# Patient Record
Sex: Female | Born: 1970 | Hispanic: Yes | Marital: Married | Smoking: Never smoker
Health system: Southern US, Community
[De-identification: ages and names within clinical notes are randomized; demographics above are authoritative.]

## PROBLEM LIST (undated history)

## (undated) DIAGNOSIS — I10 Essential (primary) hypertension: Secondary | ICD-10-CM

---

## 2017-04-14 LAB — POCT LIPID PANEL
HDL: 59
LDL: 108
TC: 189
TRG: 110

## 2017-04-14 LAB — GLUCOSE, POCT (MANUAL RESULT ENTRY): POC Glucose: 103 mg/dl — AB (ref 70–99)

## 2019-11-13 ENCOUNTER — Encounter (HOSPITAL_COMMUNITY): Payer: Self-pay

## 2019-11-13 ENCOUNTER — Emergency Department (HOSPITAL_COMMUNITY)
Admission: EM | Admit: 2019-11-13 | Discharge: 2019-11-14 | Disposition: A | Discharge status: Home / Self Care | Payer: Self-pay | Attending: Emergency Medicine | Admitting: Emergency Medicine

## 2019-11-13 ENCOUNTER — Emergency Department (HOSPITAL_COMMUNITY): Payer: Self-pay

## 2019-11-13 DIAGNOSIS — M25562 Pain in left knee: Secondary | ICD-10-CM | POA: Insufficient documentation

## 2019-11-13 DIAGNOSIS — M549 Dorsalgia, unspecified: Secondary | ICD-10-CM | POA: Diagnosis not present

## 2019-11-13 DIAGNOSIS — I10 Essential (primary) hypertension: Secondary | ICD-10-CM | POA: Diagnosis not present

## 2019-11-13 DIAGNOSIS — Y92410 Unspecified street and highway as the place of occurrence of the external cause: Secondary | ICD-10-CM | POA: Insufficient documentation

## 2019-11-13 DIAGNOSIS — M542 Cervicalgia: Secondary | ICD-10-CM | POA: Diagnosis not present

## 2019-11-13 DIAGNOSIS — R519 Headache, unspecified: Secondary | ICD-10-CM | POA: Insufficient documentation

## 2019-11-13 DIAGNOSIS — W228XXA Striking against or struck by other objects, initial encounter: Secondary | ICD-10-CM | POA: Insufficient documentation

## 2019-11-13 HISTORY — DX: Essential (primary) hypertension: I10

## 2019-11-13 LAB — POC URINE PREG, ED: Preg Test, Ur: NEGATIVE

## 2019-11-13 MED ORDER — HYDROCODONE-ACETAMINOPHEN 5-325 MG PO TABS
1.0000 | ORAL_TABLET | Freq: Once | ORAL | Status: AC
  Administered 2019-11-13: 1 via ORAL
  Filled 2019-11-13: qty 1
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
## 2019-11-14 MED ORDER — METHOCARBAMOL 500 MG PO TABS: 500.0000 mg | ORAL_TABLET | Freq: Two times a day (BID) | ORAL | 0 refills | Status: AC

## 2019-11-14 MED ORDER — NAPROXEN 500 MG PO TABS: 500.0000 mg | ORAL_TABLET | Freq: Two times a day (BID) | ORAL | 0 refills | Status: AC

## 2019-11-14 NOTE — Discharge Instructions (Signed)
Return for new or worsening symptoms
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
